# Patient Record
Sex: Female | Born: 2005
Health system: Southern US, Community
[De-identification: ages and names within clinical notes are randomized; demographics above are authoritative.]

---
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
## 2024-02-15 DIAGNOSIS — F411 Generalized anxiety disorder: Secondary | ICD-10-CM | POA: Diagnosis not present

## 2024-02-15 DIAGNOSIS — F330 Major depressive disorder, recurrent, mild: Secondary | ICD-10-CM | POA: Diagnosis not present

## 2024-02-15 DIAGNOSIS — N6342 Unspecified lump in left breast, subareolar: Secondary | ICD-10-CM | POA: Diagnosis not present

## 2024-02-15 DIAGNOSIS — N926 Irregular menstruation, unspecified: Secondary | ICD-10-CM | POA: Diagnosis not present

## 2024-02-15 DIAGNOSIS — Z3202 Encounter for pregnancy test, result negative: Secondary | ICD-10-CM | POA: Diagnosis not present

## 2024-02-15 DIAGNOSIS — N946 Dysmenorrhea, unspecified: Secondary | ICD-10-CM | POA: Diagnosis not present

## 2024-02-17 ENCOUNTER — Other Ambulatory Visit: Payer: Self-pay | Admitting: Family Medicine

## 2024-02-17 DIAGNOSIS — N6342 Unspecified lump in left breast, subareolar: Secondary | ICD-10-CM

## 2024-03-07 ENCOUNTER — Other Ambulatory Visit: Payer: Self-pay | Admitting: Family Medicine

## 2024-03-07 ENCOUNTER — Ambulatory Visit
Admission: RE | Admit: 2024-03-07 | Discharge: 2024-03-07 | Disposition: A | Discharge status: Home / Self Care | Source: Ambulatory Visit | Attending: Family Medicine | Admitting: Family Medicine

## 2024-03-07 DIAGNOSIS — N6342 Unspecified lump in left breast, subareolar: Secondary | ICD-10-CM

## 2024-03-07 DIAGNOSIS — N6320 Unspecified lump in the left breast, unspecified quadrant: Secondary | ICD-10-CM

## 2024-03-07 DIAGNOSIS — N6321 Unspecified lump in the left breast, upper outer quadrant: Secondary | ICD-10-CM | POA: Diagnosis not present

## 2024-03-07 DIAGNOSIS — N6324 Unspecified lump in the left breast, lower inner quadrant: Secondary | ICD-10-CM | POA: Diagnosis not present

## 2024-04-18 DIAGNOSIS — F411 Generalized anxiety disorder: Secondary | ICD-10-CM | POA: Diagnosis not present

## 2024-04-18 DIAGNOSIS — N946 Dysmenorrhea, unspecified: Secondary | ICD-10-CM | POA: Diagnosis not present

## 2024-04-18 DIAGNOSIS — G43009 Migraine without aura, not intractable, without status migrainosus: Secondary | ICD-10-CM | POA: Diagnosis not present

## 2024-04-18 DIAGNOSIS — F330 Major depressive disorder, recurrent, mild: Secondary | ICD-10-CM | POA: Diagnosis not present

## 2024-08-18 DIAGNOSIS — R112 Nausea with vomiting, unspecified: Secondary | ICD-10-CM | POA: Diagnosis not present

## 2024-08-18 DIAGNOSIS — Z309 Encounter for contraceptive management, unspecified: Secondary | ICD-10-CM | POA: Diagnosis not present

## 2024-08-18 DIAGNOSIS — G43109 Migraine with aura, not intractable, without status migrainosus: Secondary | ICD-10-CM | POA: Diagnosis not present

## 2024-09-01 DIAGNOSIS — N390 Urinary tract infection, site not specified: Secondary | ICD-10-CM | POA: Diagnosis not present

## 2024-09-12 ENCOUNTER — Other Ambulatory Visit: Payer: Self-pay | Admitting: Family Medicine

## 2024-09-12 ENCOUNTER — Ambulatory Visit
Admission: RE | Admit: 2024-09-12 | Discharge: 2024-09-12 | Disposition: A | Source: Ambulatory Visit | Attending: Family Medicine | Admitting: Family Medicine

## 2024-09-12 DIAGNOSIS — N6324 Unspecified lump in the left breast, lower inner quadrant: Secondary | ICD-10-CM | POA: Diagnosis not present

## 2024-09-12 DIAGNOSIS — N6320 Unspecified lump in the left breast, unspecified quadrant: Secondary | ICD-10-CM

## 2024-09-14 ENCOUNTER — Other Ambulatory Visit: Payer: Self-pay | Admitting: Family Medicine

## 2024-09-14 DIAGNOSIS — R928 Other abnormal and inconclusive findings on diagnostic imaging of breast: Secondary | ICD-10-CM

## 2025-03-15 ENCOUNTER — Other Ambulatory Visit
